# Patient Record
Sex: Female | Born: 2000 | Hispanic: No | Marital: Married | Smoking: Never smoker
Health system: Southern US, Community
[De-identification: ages and names within clinical notes are randomized; demographics above are authoritative.]

## PROBLEM LIST (undated history)

## (undated) DIAGNOSIS — K921 Melena: Secondary | ICD-10-CM

## (undated) DIAGNOSIS — J45909 Unspecified asthma, uncomplicated: Secondary | ICD-10-CM

## (undated) HISTORY — DX: Melena: K92.1

## (undated) HISTORY — PX: TYMPANOSTOMY TUBE PLACEMENT: SHX32

---
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
## 2021-06-04 DIAGNOSIS — Z0000 Encounter for general adult medical examination without abnormal findings: Secondary | ICD-10-CM | POA: Diagnosis not present

## 2021-07-17 DIAGNOSIS — Z30431 Encounter for routine checking of intrauterine contraceptive device: Secondary | ICD-10-CM | POA: Diagnosis not present

## 2021-07-27 DIAGNOSIS — Z30431 Encounter for routine checking of intrauterine contraceptive device: Secondary | ICD-10-CM | POA: Diagnosis not present

## 2021-08-10 ENCOUNTER — Encounter: Admitting: Certified Nurse Midwife

## 2022-01-21 ENCOUNTER — Telehealth: Payer: Self-pay | Admitting: Pediatrics

## 2022-01-21 NOTE — Telephone Encounter (Signed)
..  Patient declines further follow up and engagement by the Managed Medicaid Team. Appropriate care team members and provider have been notified via electronic communication. The Managed Medicaid Team is available to follow up with the patient after provider conversation with the patient regarding recommendation for engagement and subsequent re-referral to the Managed Medicaid Team.    Jennifer Alley Care Guide, High Risk Medicaid Managed Care Embedded Care Coordination [place]  [facility]   

## 2022-07-01 ENCOUNTER — Telehealth: Payer: Medicaid Other | Admitting: Family Medicine

## 2022-07-01 DIAGNOSIS — R3989 Other symptoms and signs involving the genitourinary system: Secondary | ICD-10-CM

## 2022-07-01 MED ORDER — NITROFURANTOIN MONOHYD MACRO 100 MG PO CAPS: 100.0000 mg | ORAL_CAPSULE | Freq: Two times a day (BID) | ORAL | 0 refills | Status: AC

## 2022-07-01 NOTE — Progress Notes (Signed)
